# Patient Record
Sex: Female | Born: 1950 | Race: White | Hispanic: No | Marital: Married | Smoking: Never smoker
Health system: Southern US, Community
[De-identification: ages and names within clinical notes are randomized; demographics above are authoritative.]

## PROBLEM LIST (undated history)

## (undated) DIAGNOSIS — I10 Essential (primary) hypertension: Secondary | ICD-10-CM

## (undated) HISTORY — PX: COLON SURGERY: SHX602

## (undated) HISTORY — PX: CHOLECYSTECTOMY: SHX55

## (undated) HISTORY — PX: ABDOMINAL HYSTERECTOMY: SHX81

## (undated) HISTORY — PX: TONSILLECTOMY: SUR1361

---

## 2020-01-20 ENCOUNTER — Emergency Department (HOSPITAL_BASED_OUTPATIENT_CLINIC_OR_DEPARTMENT_OTHER): Payer: Medicare Other

## 2020-01-20 ENCOUNTER — Encounter (HOSPITAL_BASED_OUTPATIENT_CLINIC_OR_DEPARTMENT_OTHER): Payer: Self-pay | Admitting: *Deleted

## 2020-01-20 ENCOUNTER — Other Ambulatory Visit: Payer: Self-pay

## 2020-01-20 ENCOUNTER — Emergency Department (HOSPITAL_BASED_OUTPATIENT_CLINIC_OR_DEPARTMENT_OTHER)
Admission: EM | Admit: 2020-01-20 | Discharge: 2020-01-20 | Disposition: A | Discharge status: Home / Self Care | Payer: Medicare Other | Attending: Emergency Medicine | Admitting: Emergency Medicine

## 2020-01-20 DIAGNOSIS — R519 Headache, unspecified: Secondary | ICD-10-CM | POA: Diagnosis present

## 2020-01-20 DIAGNOSIS — G4489 Other headache syndrome: Secondary | ICD-10-CM | POA: Insufficient documentation

## 2020-01-20 DIAGNOSIS — R030 Elevated blood-pressure reading, without diagnosis of hypertension: Secondary | ICD-10-CM | POA: Insufficient documentation

## 2020-01-20 DIAGNOSIS — Z7982 Long term (current) use of aspirin: Secondary | ICD-10-CM | POA: Insufficient documentation

## 2020-01-20 DIAGNOSIS — I10 Essential (primary) hypertension: Secondary | ICD-10-CM

## 2020-01-20 DIAGNOSIS — Z79899 Other long term (current) drug therapy: Secondary | ICD-10-CM | POA: Diagnosis not present

## 2020-01-20 HISTORY — DX: Essential (primary) hypertension: I10

## 2020-01-20 LAB — CBC WITH DIFFERENTIAL/PLATELET
Abs Immature Granulocytes: 0.02 10*3/uL (ref 0.00–0.07)
Basophils Absolute: 0 10*3/uL (ref 0.0–0.1)
Basophils Relative: 1 %
Eosinophils Absolute: 0.1 10*3/uL (ref 0.0–0.5)
Eosinophils Relative: 3 %
HCT: 44.7 % (ref 36.0–46.0)
Hemoglobin: 14.6 g/dL (ref 12.0–15.0)
Immature Granulocytes: 0 %
Lymphocytes Relative: 25 %
Lymphs Abs: 1.3 10*3/uL (ref 0.7–4.0)
MCH: 28.5 pg (ref 26.0–34.0)
MCHC: 32.7 g/dL (ref 30.0–36.0)
MCV: 87.1 fL (ref 80.0–100.0)
Monocytes Absolute: 0.4 10*3/uL (ref 0.1–1.0)
Monocytes Relative: 8 %
Neutro Abs: 3.3 10*3/uL (ref 1.7–7.7)
Neutrophils Relative %: 63 %
Platelets: 295 10*3/uL (ref 150–400)
RBC: 5.13 MIL/uL — ABNORMAL HIGH (ref 3.87–5.11)
RDW: 13.3 % (ref 11.5–15.5)
WBC: 5.3 10*3/uL (ref 4.0–10.5)
nRBC: 0 % (ref 0.0–0.2)

## 2020-01-20 LAB — BASIC METABOLIC PANEL
Anion gap: 10 (ref 5–15)
BUN: 11 mg/dL (ref 8–23)
CO2: 26 mmol/L (ref 22–32)
Calcium: 9.1 mg/dL (ref 8.9–10.3)
Chloride: 105 mmol/L (ref 98–111)
Creatinine, Ser: 0.68 mg/dL (ref 0.44–1.00)
GFR calc Af Amer: >60 mL/min (ref >60)
GFR calc non Af Amer: >60 mL/min (ref >60)
Glucose, Bld: 102 mg/dL — ABNORMAL HIGH (ref 70–99)
Potassium: 4.2 mmol/L (ref 3.5–5.1)
Sodium: 141 mmol/L (ref 135–145)

## 2020-01-20 MED ORDER — METOCLOPRAMIDE HCL 5 MG/ML IJ SOLN
5.0000 mg | Freq: Once | INTRAMUSCULAR | Status: AC
  Administered 2020-01-20: 5 mg via INTRAVENOUS
  Filled 2020-01-20: qty 2

## 2020-01-20 MED ORDER — OLMESARTAN MEDOXOMIL 5 MG PO TABS
5.0000 mg | ORAL_TABLET | Freq: Every day | ORAL | 0 refills | Status: AC
Start: 2020-01-20 — End: 2020-02-19

## 2020-01-20 NOTE — ED Notes (Signed)
Patient transported to CT 

## 2020-01-20 NOTE — Discharge Instructions (Addendum)
Labs and CT normal  Cause of headache is unclear, may be related to spike in blood pressure  No indication for further work up or admission  Please call primary care doctor and schedule an appointment for further discussion of headache, blood pressure. You may need blood pressure medicines and adjustment or neurology evaluation  Return for sudden, severe headache or any other stroke symptoms, chest pain or shortness of breath

## 2020-01-20 NOTE — ED Provider Notes (Signed)
[facility] EMERGENCY DEPARTMENT Provider Note   CSN: [id number] Arrival date & time: 01/20/20  1032     History Chief Complaint  Patient presents with  . Hypertension    Kathleen Robbins is a 69 y.o. female with history of hypertension presents from urgent care for evaluation of headache.  Reports mild headache on Sunday, gradually worsening throughout the following couple of days. Acutely worsened around 3 pm yesterday. Took 4 ibuprofen and 2 tylenols yesterday and headache did not improve, went to sleep to sleep it off.  Head pain described as "fullness" on the top, constant but much better now and mild.  She called her friend yesterday evening who is a Engineer, civil (consulting) and friend states on the phone patient had slurred and slow speech around 6-630pm.  She went over to her house and her blood pressure at that time was 182/110 manual.  Patient also felt dizzy described as feeling generally weak with leg weakness, like she was going to faint but also feeling like she couldn't "control" her legs or make them move, and it was difficult to walk in a straight line, held onto the wall to prevent falling.  Had no associated chest pain, shortness of breath, palpitations, diaphoresis, syncope, nausea, vomiting.  Woke up this morning with continued headache and dizziness with standing and walking.  Nurse friend states she looked "unwell" and urged to go to urgent care.  Woke up this morning and headache was better.  Was on benicar for a while but BP normalized and has not taken this in a long time.  Reports granmother and mother died of a "stroke" but unsure what type.  Mother had "a lot of TIAs" but also Lewy body dementia and symptoms were unclear.    No associated visual disturbances, diplopia.  No blood thinners. Taking fish oil and baby aspirin.  Had COVID and told to take aspirin after illness.  Takes muscadine juice which states helps bring her BP down. Does not usually check her BP at home, unknown  baseline.   HPI     Past Medical History:  Diagnosis Date  . Hypertension    20 years ago, quit taking meds d/t BP being low    There are no problems to display for this patient.   Past Surgical History:  Procedure Laterality Date  . ABDOMINAL HYSTERECTOMY    . CHOLECYSTECTOMY    . COLON SURGERY     tumor removal  . TONSILLECTOMY       OB History    Gravida  2   Para  2   Term      Preterm      AB      Living        SAB      TAB      Ectopic      Multiple      Live Births              Family History  Problem Relation Age of Onset  . Stroke Mother   . Alzheimer's disease Father     Social History   Tobacco Use  . Smoking status: Never Smoker  . Smokeless tobacco: Never Used  Substance Use Topics  . Alcohol use: Never  . Drug use: Never    Home Medications Prior to Admission medications   Medication Sig Start Date End Date Taking? Authorizing Provider  aspirin EC 81 MG tablet Take 81 mg by mouth daily. Swallow whole.   Yes  [provider]  cholecalciferol (VITAMIN D3) 25 MCG (1000 UNIT) tablet Take 5,000 Units by mouth daily.   Yes [provider]  Omega-3 Fatty Acids (FISH OIL) 1000 MG CAPS Take 2,000 mg by mouth every morning.   Yes [provider]  olmesartan (BENICAR) 5 MG tablet Take 1 tablet (5 mg total) by mouth daily. 01/20/20 02/19/20  Liberty HandyGibbons, Philip Kotlyar J, PA-C    Allergies    Patient has no known allergies.  Review of Systems   Review of Systems  Neurological: Positive for dizziness and headaches.  All other systems reviewed and are negative.   Physical Exam Updated Vital Signs BP (!) 144/72 (BP Location: Left Arm)   Pulse (!) 58   Temp 98.5 F (36.9 C) (Oral)   Resp 18   Ht 5\' 5"  (1.651 m)   Wt 88.5 kg   SpO2 96%   BMI 32.45 kg/m   Physical Exam Vitals and nursing note reviewed.  Constitutional:      General: She is not in acute distress.    Appearance: She is well-developed.      Comments: NAD.  HENT:     Head: Normocephalic and atraumatic.     Right Ear: External ear normal.     Left Ear: External ear normal.     Nose: Nose normal.  Eyes:     General: No scleral icterus.    Conjunctiva/sclera: Conjunctivae normal.  Cardiovascular:     Rate and Rhythm: Normal rate and regular rhythm.     Heart sounds: Normal heart sounds.  Pulmonary:     Effort: Pulmonary effort is normal.     Breath sounds: Normal breath sounds.  Musculoskeletal:        General: Normal range of motion.     Cervical back: Normal range of motion and neck supple.  Skin:    General: Skin is warm and dry.     Capillary Refill: Capillary refill takes less than 2 seconds.  Neurological:     Mental Status: She is alert and oriented to person, place, and time.     Comments:   Mental Status: Patient is awake, alert, oriented to person, place, year, and situation. Patient is able to give a clear and coherent history. Speech is fluent and clear without dysarthria or aphasia. No signs of neglect.  Cranial Nerves: I not tested II visual fields full bilaterally. PERRL.  Unable to visualize posterior eye. III, IV, VI EOMs intact without ptosis or diplopia  V sensation to light touch intact in all 3 divisions of trigeminal nerve bilaterally  VII facial movements symmetric bilaterally VIII hearing intact to voice/conversation  IX, X no uvula deviation, symmetric rise of soft palate/uvula XI 5/5 SCM and trapezius strength bilaterally  XII tongue protrusion midline, symmetric L/R movements  Motor:  Strength 5/5 in upper/lower extremities.  Sensation to light touch intact in face, upper/lower extremities.  No pronator drift. No leg drop.  Cerebellar:  No ataxia with finger to nose. Steady gait around bed, she denies gait issue, dizziness.  Normal Romberg.  Psychiatric:        Behavior: Behavior normal.        Thought Content: Thought content normal.        Judgment: Judgment normal.     ED Results  / Procedures / Treatments   Labs (all labs ordered are listed, but only abnormal results are displayed) Labs Reviewed  CBC WITH DIFFERENTIAL/PLATELET - Abnormal; Notable for the following components:  Result Value   RBC 5.13 (*)    All other components within normal limits  BASIC METABOLIC PANEL - Abnormal; Notable for the following components:   Glucose, Bld 102 (*)    All other components within normal limits    EKG EKG Interpretation  Date/Time:  Wednesday January 20 2020 14:37:31 EDT Ventricular Rate:  58 PR Interval:    QRS Duration: 86 QT Interval:  418 QTC Calculation: 411 R Axis:   -7 Text Interpretation: Sinus rhythm Low voltage, precordial leads Nonspecific T wave abnormality No previous tracing Confirmed by Cathren Laine ([id number]) on 01/20/2020 2:39:55 PM   Radiology CT Head Wo Contrast  Result Date: 01/20/2020 CLINICAL DATA:  Headache Sunday, acutely worsening 3 p.m. yesterday, slurred speech. Additional provided: High blood pressure, dizziness, headache, weakness in legs, slurred speech yesterday. EXAM: CT HEAD WITHOUT CONTRAST TECHNIQUE: Contiguous axial images were obtained from the base of the skull through the vertex without intravenous contrast. COMPARISON:  No pertinent prior studies available for comparison. FINDINGS: Brain: Cerebral volume is normal for age. There is no acute intracranial hemorrhage. No demarcated cortical infarct. No extra-axial fluid collection. No evidence of intracranial mass. No midline shift. Vascular: No hyperdense vessel.  Atherosclerotic calcifications. Skull: Normal. Negative for fracture or focal suspicious lesion. Sinuses/Orbits: Visualized orbits show no acute finding. No significant paranasal sinus disease or mastoid effusion at the imaged levels. IMPRESSION: Unremarkable non-contrast CT appearance of the brain. No evidence of acute intracranial abnormality. Electronically Signed   By: Jackey Loge DO   On: 01/20/2020 13:28     Procedures Procedures (including critical care time)  Medications Ordered in ED Medications  metoCLOPramide (REGLAN) injection 5 mg (5 mg Intravenous Given 01/20/20 1237)    ED Course  I have reviewed the triage vital signs and the nursing notes.  Pertinent labs & imaging results that were available during my care of the patient were reviewed by me and considered in my medical decision making (see chart for details).  Clinical Course as of Jan 19 1726  Wed Jan 20, 2020  1658 NSTWA V1-V3  EKG 12-Lead [CG]    Clinical Course User Index [CG] Liberty Handy, PA-C   MDM Rules/Calculators/A&P                         Patient complains of mild, gradually worsening headache for the last several days, acutely worsening yesterday at 3 PM associated with slurred/slow speech, dizziness.  Rest of the ED essentially asymptomatic with very mild headache.  Denies any ongoing dizziness.  Exam benign without any focal neuro deficits.  Hypertensive on arrival but this improved significantly after Reglan for headache.  I obtained additional history from triage, nursing notes and review of medical chart.  Previous medical records available, nursing notes reviewed to obtain more history and assist with MDM.  Urgent care note reviewed.  The differential diagnosis includes primary headache like tension headache, migraine, complex migraine.  Given benign exam and complete resolution of symptoms, meningitis, ICH, TIA, stroke considered less likely.  No red flags associated with headache like fever, confusion, trauma, anticoagulant use, neck pain or rigidity or persistent headache.  Have ordered screening labs.  I ordered head CT.  Laboratory studies and imaging ordered in triage by triage RN.  I have personally visualized and interpreted these.  Lab work today including labs and CT are nonacute.  EKG without arrhythmias other than nonspecific T wave abnormalities.  She has no chest pain,  shortness of  breath.  Ordered Reglan for headache, patient asymptomatic.  Patient reevaluated and remains asymptomatic with improved BP here.   Patient discussed with EDP given given headache onset more than 6 to 12 hours ago we considered subacute hemorrhagic stroke however this is very unlikely given her benign exam here and complete resolution of symptoms.  Further work-up, lumbar puncture and not indicated.  This was thoroughly explained with patient and friend who is a Engineer, civil (consulting) and they are agreeable.  We will discharge with refill on her blood pressure medicine.  Recommended close follow-up with PCP.  Strict return precautions given.  Patient is comfortable with ER POC and discharge plan  Final Clinical Impression(s) / ED Diagnoses Final diagnoses:  Headache syndrome  Elevated blood pressure reading with diagnosis of hypertension    Rx / DC Orders ED Discharge Orders         Ordered    olmesartan (BENICAR) 5 MG tablet  Daily     Discontinue  Reprint     01/20/20 1651           Liberty Handy, PA-C 01/20/20 1727    Cathren Laine, MD 01/21/20 [phone]

## 2020-01-20 NOTE — ED Notes (Signed)
ED Provider at bedside. 

## 2020-01-20 NOTE — ED Triage Notes (Signed)
High blood pressure yesterday (164/110 @ 7:30 pm), slurred speech.

## 2021-06-04 IMAGING — CT CT HEAD W/O CM
3 series · 15 of 47 positions shown, 18 images · non-contrast
Comparison: No pertinent prior studies available for comparison.

CLINICAL DATA: Headache [REDACTED], acutely worsening 3 p.m. yesterday,
slurred speech. Additional provided: High blood pressure, dizziness,
headache, weakness in legs, slurred speech yesterday.

EXAM:
CT HEAD WITHOUT CONTRAST
TECHNIQUE: Contiguous axial images were obtained from the base of the skull
through the vertex without intravenous contrast.

[Series 2: head wo · axial · 0.44mm/px · z∈[-166,-41]mm · 9 of 31 slices shown, 12 images]
[im 3/31  brain]
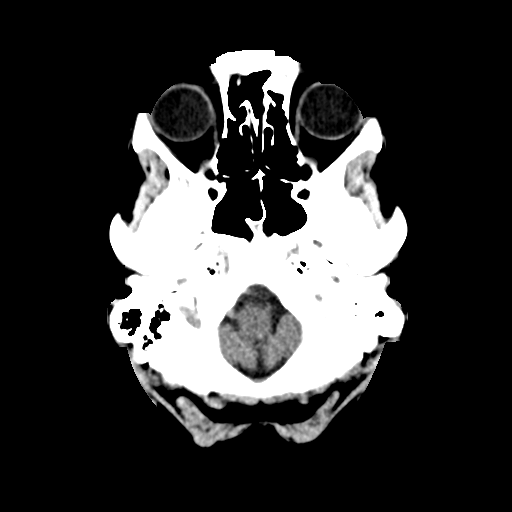
[im 3/31  bone]
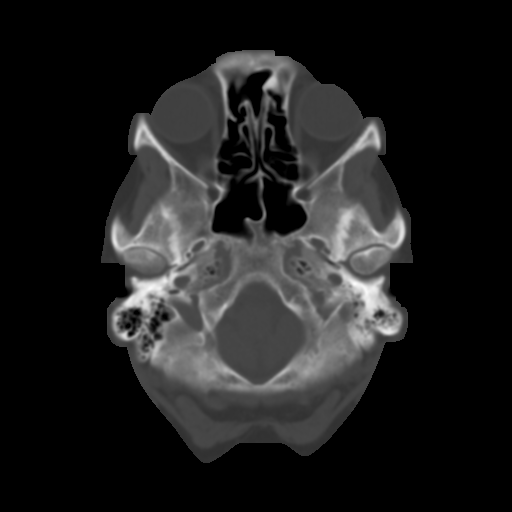
[im 6/31  brain]
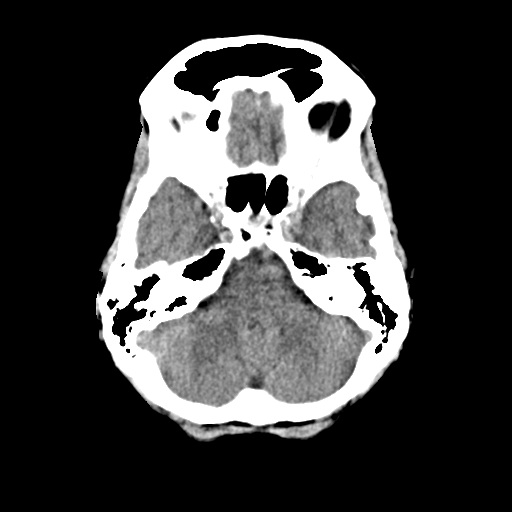
[im 9/31  brain]
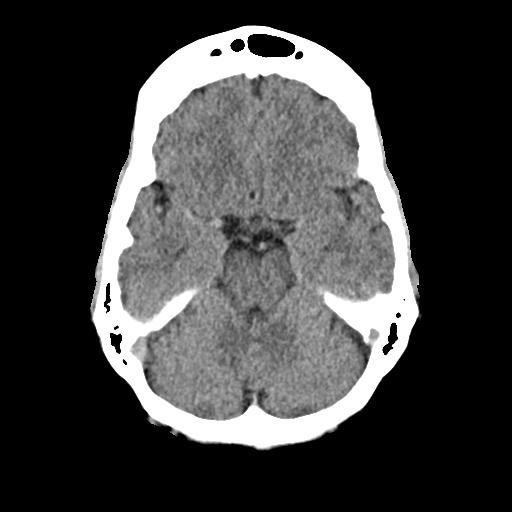
[im 12/31  brain]
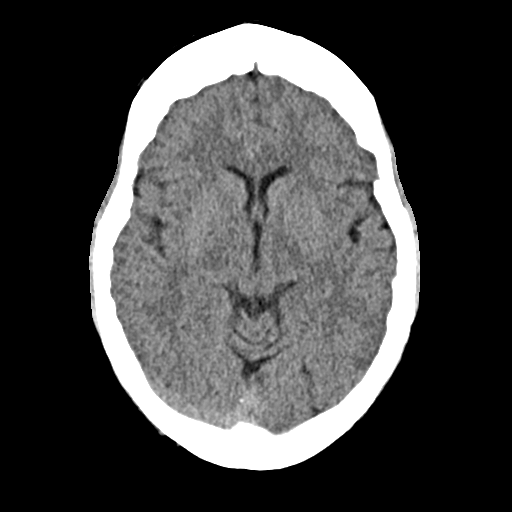
[im 16/31  brain]
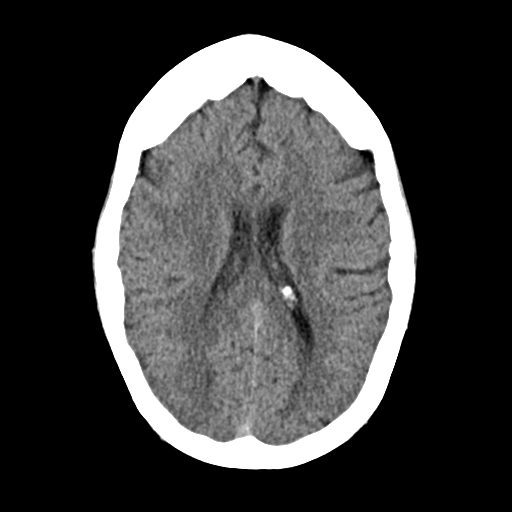
[im 16/31  bone]
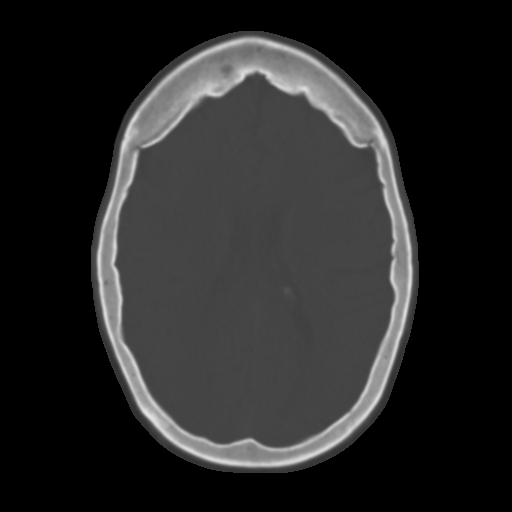
[im 19/31  brain]
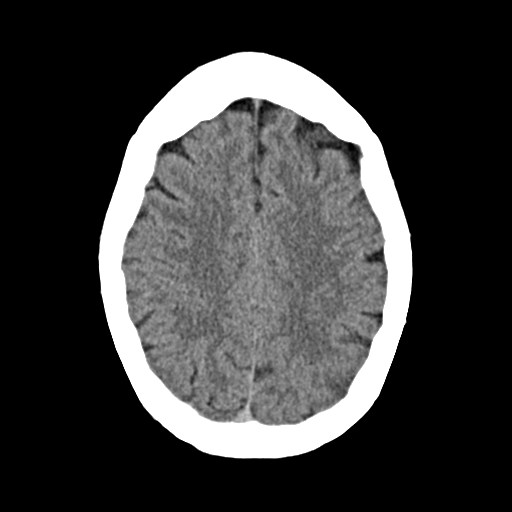
[im 22/31  brain]
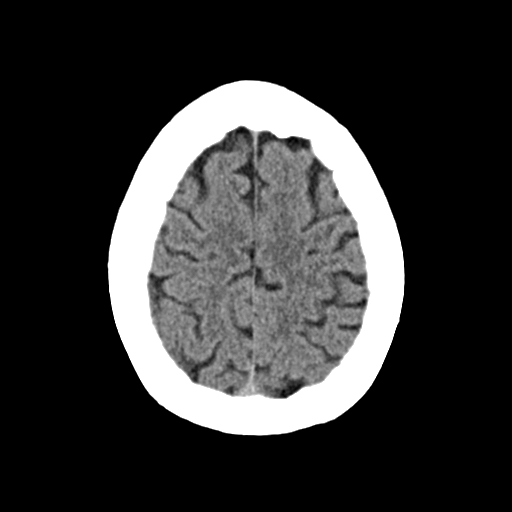
[im 25/31  brain]
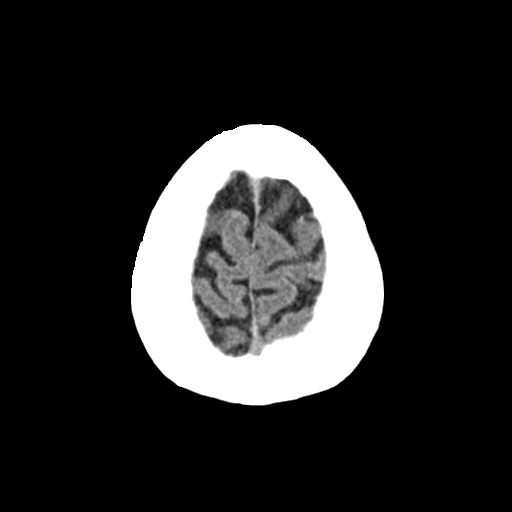
[im 28/31  brain]
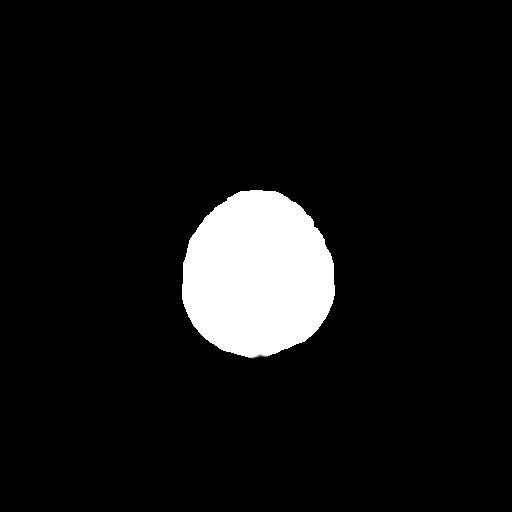
[im 28/31  bone]
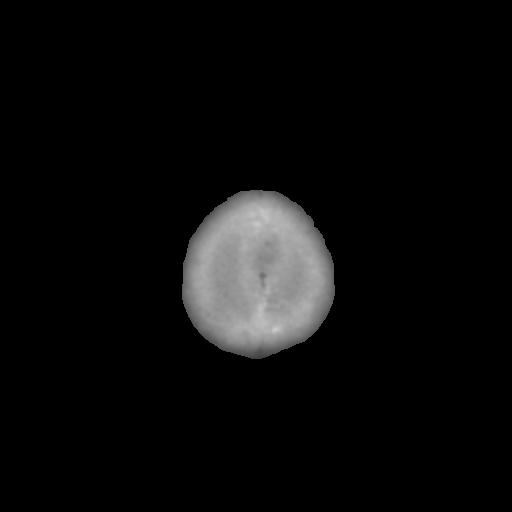

[Series 4: coronal soft · coronal · 0.31mm/px · 3 of 68 slices shown]
[im 23/68  brain]
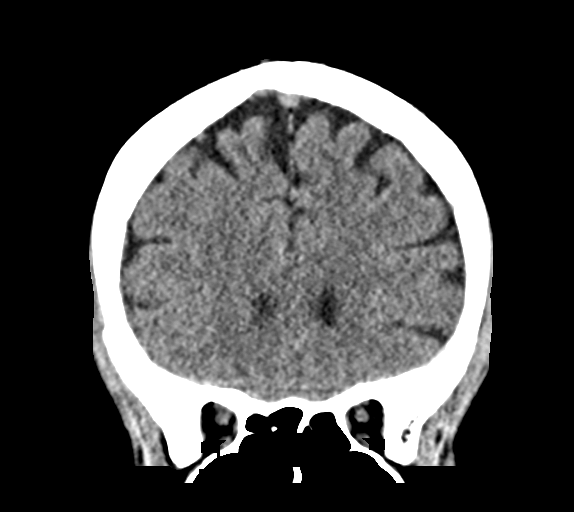
[im 30/68  brain]
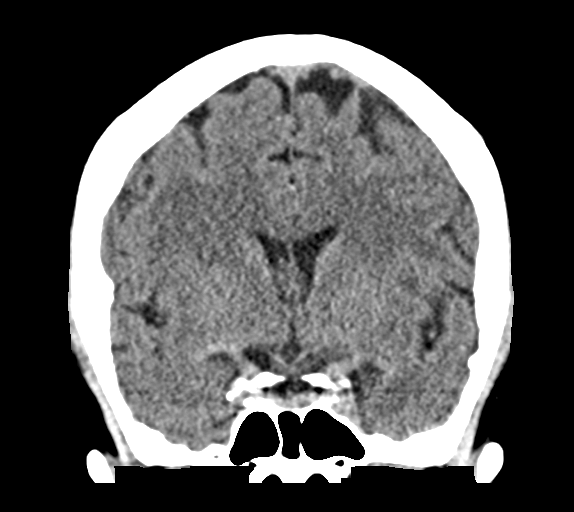
[im 38/68  brain]
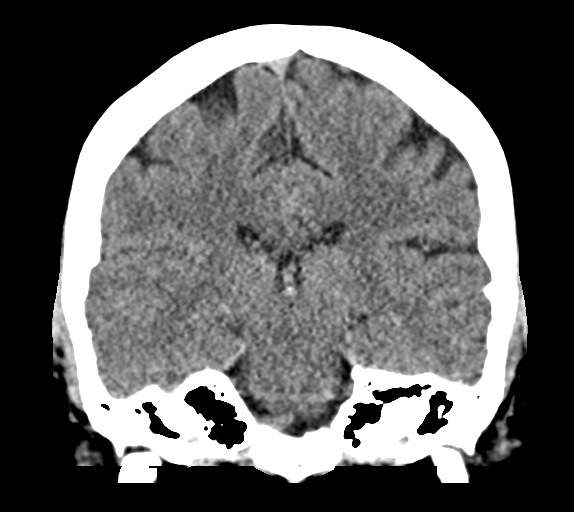

[Series 5: sag soft · sagittal · 0.31mm/px · 3 of 54 slices shown]
[im 18/54  brain]
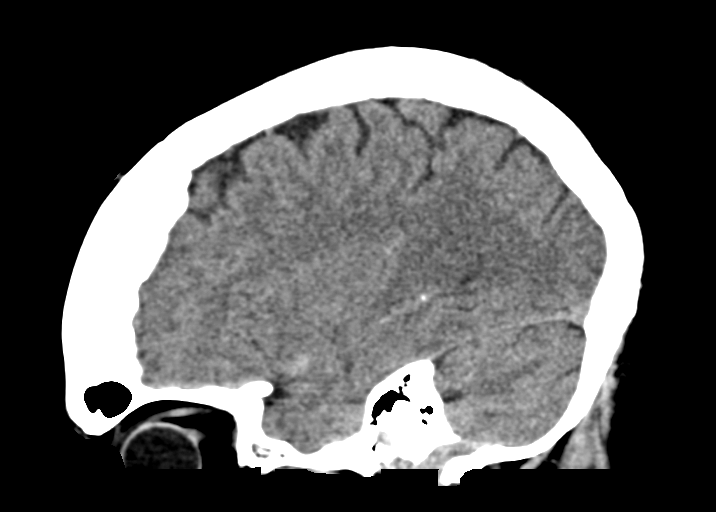
[im 27/54  brain]
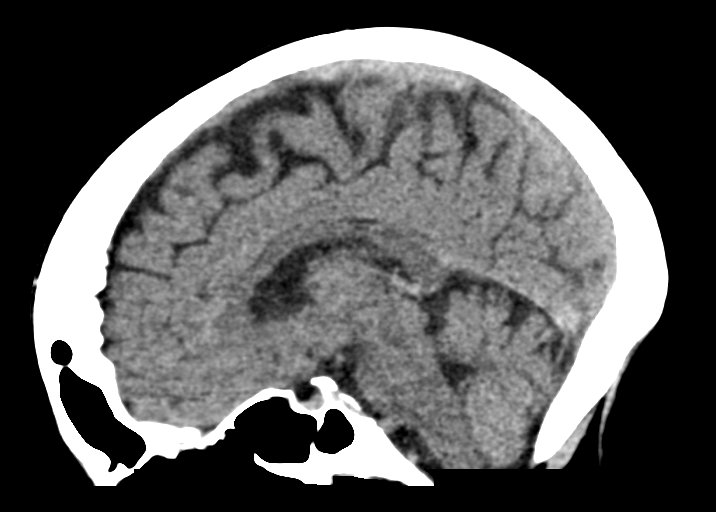
[im 36/54  brain]
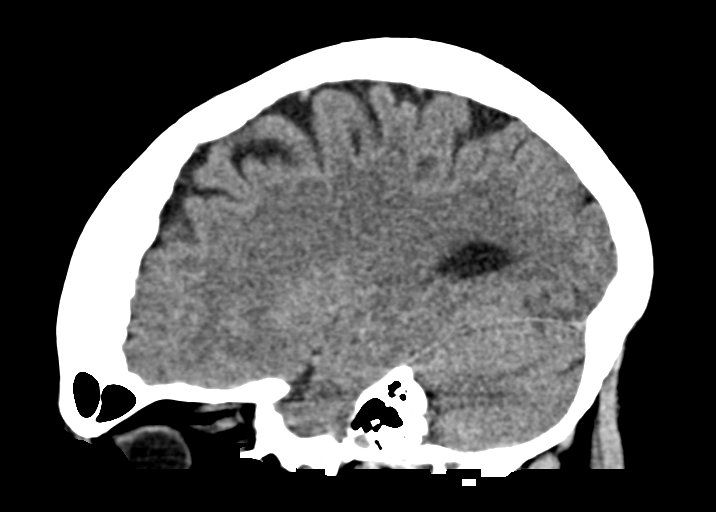

[15 of 47 positions shown; findings below may reference images not displayed]

FINDINGS: Brain:

Cerebral volume is normal for age.

There is no acute intracranial hemorrhage.

No demarcated cortical infarct.

No extra-axial fluid collection.

No evidence of intracranial mass.

No midline shift.

Vascular: No hyperdense vessel.  Atherosclerotic calcifications.

Skull: Normal. Negative for fracture or focal suspicious lesion.

Sinuses/Orbits: Visualized orbits show no acute finding. No
significant paranasal sinus disease or mastoid effusion at the
imaged levels.
IMPRESSION: Unremarkable non-contrast CT appearance of the brain. No evidence of
acute intracranial abnormality.
